# Patient Record
Sex: Female | Born: 1985 | Race: White | Hispanic: No | Marital: Married | Smoking: Never smoker
Health system: Southern US, Community
[De-identification: ages and names within clinical notes are randomized; demographics above are authoritative.]

## PROBLEM LIST (undated history)

## (undated) DIAGNOSIS — Z789 Other specified health status: Secondary | ICD-10-CM

## (undated) HISTORY — PX: WISDOM TOOTH EXTRACTION: SHX21

## (undated) HISTORY — PX: NO PAST SURGERIES: SHX2092

---

## 2016-12-09 NOTE — L&D Delivery Note (Signed)
Operative Delivery Note At 8:53 PM a viable female was delivered via Vaginal, Vacuum Investment banker, operational).  Presentation: vertex; Position: Occiput, anterior, Station: +2.  Patient pushed one hour with good effort and good descent. However, meternal and fetal tachycardia noted with temperature of 100.85F. Fetal heart rate then had decreased variability unresponsive to scalp stimulation and with recurrent variable decelerations. Decision made to proceed with VAVD.  Verbal consent: obtained from patient.  Risks and benefits discussed in detail.  Risks include, but are not limited to the risks of anesthesia, bleeding, infection, damage to maternal tissues, fetal cephalhematoma.  There is also the risk of inability to effect vaginal delivery of the head, or shoulder dystocia that cannot be resolved by established maneuvers, leading to the need for emergency cesarean section. Verbally consented and agreed. Bladder was emptied. EFW 7#. Anesthesia found to be adequate. Position confirmed to be OA. Pelvis felt to be adequate. Vacuum placed at flexion point. Vertex easily delivered over two contractions.    APGAR: 9, ; weight  .   Placenta status: Routine .   Cord:  WNL with the following complications: chorioamnionitis.  Cord pH: sent  Anesthesia:   Instruments: None Episiotomy: None Lacerations: 2nd degree;Vaginal Suture Repair: 2.0 3.0 vicryl rapide Est. Blood Loss (mL):  800cc, mostly s/s vaginal tissue being denuded, no bleeding after repair. No atony. Placenta intact. Rectal exam wnl - no evidence of occult third or fourth degree.   It's a girl "Sheryl Green"! Mom to postpartum.  Baby to Couplet care / Skin to Skin.  Ranae Pila 08/22/2017, 9:26 PM

## 2017-02-04 LAB — OB RESULTS CONSOLE HEPATITIS B SURFACE ANTIGEN
HEP B S AG: NEGATIVE
Hepatitis B Surface Ag: NEGATIVE

## 2017-02-04 LAB — OB RESULTS CONSOLE ABO/RH
RH TYPE: POSITIVE
RH Type: POSITIVE

## 2017-02-04 LAB — OB RESULTS CONSOLE GC/CHLAMYDIA
Chlamydia: NEGATIVE
Chlamydia: NEGATIVE
Gonorrhea: NEGATIVE
Gonorrhea: NEGATIVE

## 2017-02-04 LAB — OB RESULTS CONSOLE ANTIBODY SCREEN
ANTIBODY SCREEN: NEGATIVE
Antibody Screen: NEGATIVE

## 2017-02-04 LAB — OB RESULTS CONSOLE RUBELLA ANTIBODY, IGM
Rubella: IMMUNE
Rubella: IMMUNE

## 2017-02-04 LAB — OB RESULTS CONSOLE HIV ANTIBODY (ROUTINE TESTING)
HIV: NONREACTIVE
HIV: NONREACTIVE

## 2017-02-04 LAB — OB RESULTS CONSOLE RPR
RPR: NONREACTIVE
RPR: NONREACTIVE

## 2017-08-12 ENCOUNTER — Encounter (HOSPITAL_COMMUNITY): Payer: Self-pay

## 2017-08-12 LAB — OB RESULTS CONSOLE GBS: STREP GROUP B AG: POSITIVE

## 2017-08-16 NOTE — H&P (Signed)
Sheryl PolStephanie Green is a 31 y.o. female presenting for primary c/s for maternal request.  Patient has severe anxiety regarding pelvic exams and wishes primary c/s.  GBS -  Pregnancy otherwise uncomplicated. OB History    No data available     No past medical history on file. Past Surgical History:  Procedure Laterality Date  . WISDOM TOOTH EXTRACTION     Family History: family history includes Breast cancer in her maternal grandmother; Colon cancer in her maternal grandmother; Hypertension in her father. Social History:  has no tobacco, alcohol, and drug history on file.     Maternal Diabetes: No Genetic Screening: Normal Maternal Ultrasounds/Referrals: Normal Fetal Ultrasounds or other Referrals:  None Maternal Substance Abuse:  No Significant Maternal Medications:  None Significant Maternal Lab Results:  None Other Comments:  None  ROS History   There were no vitals taken for this visit. Exam Physical Exam  Prenatal labs: ABO, Rh: A/Positive/-- (02/27 0000) Antibody: Negative (02/27 0000) Rubella: Immune (02/27 0000) RPR: Nonreactive (02/27 0000)  HBsAg: Negative (02/27 0000)  HIV: Non-reactive (02/27 0000)  GBS:     Assessment/Plan: IUP at 39weeks C/S on maternal request Risks and benefits of C/S were discussed.  All questions were answered and informed consent was obtained.  Plan to proceed with low segment transverse Cesarean Section.   Sheryl Green C 08/16/2017, 11:39 AM

## 2017-08-18 ENCOUNTER — Telehealth (HOSPITAL_COMMUNITY): Payer: Self-pay | Admitting: *Deleted

## 2017-08-18 ENCOUNTER — Encounter (HOSPITAL_COMMUNITY): Payer: Self-pay

## 2017-08-18 NOTE — Telephone Encounter (Signed)
Preadmission screen  

## 2017-08-20 ENCOUNTER — Telehealth (HOSPITAL_COMMUNITY): Payer: Self-pay | Admitting: *Deleted

## 2017-08-20 NOTE — Telephone Encounter (Signed)
Preadmission screen  

## 2017-08-21 ENCOUNTER — Encounter (HOSPITAL_COMMUNITY): Payer: Self-pay

## 2017-08-22 ENCOUNTER — Encounter (HOSPITAL_COMMUNITY): Payer: Self-pay | Admitting: *Deleted

## 2017-08-22 ENCOUNTER — Inpatient Hospital Stay (HOSPITAL_COMMUNITY): Payer: BC Managed Care – PPO | Admitting: Anesthesiology

## 2017-08-22 ENCOUNTER — Inpatient Hospital Stay (HOSPITAL_COMMUNITY)
Admission: AD | Admit: 2017-08-22 | Discharge: 2017-08-24 | DRG: 775 | Disposition: A | Discharge status: Home / Self Care | Payer: BC Managed Care – PPO | Source: Ambulatory Visit | Attending: Obstetrics and Gynecology | Admitting: Obstetrics and Gynecology

## 2017-08-22 DIAGNOSIS — O411230 Chorioamnionitis, third trimester, not applicable or unspecified: Secondary | ICD-10-CM | POA: Diagnosis present

## 2017-08-22 DIAGNOSIS — O26893 Other specified pregnancy related conditions, third trimester: Secondary | ICD-10-CM | POA: Diagnosis present

## 2017-08-22 DIAGNOSIS — Z3A38 38 weeks gestation of pregnancy: Secondary | ICD-10-CM | POA: Diagnosis not present

## 2017-08-22 DIAGNOSIS — O99824 Streptococcus B carrier state complicating childbirth: Principal | ICD-10-CM | POA: Diagnosis present

## 2017-08-22 LAB — TYPE AND SCREEN
ABO/RH(D): A POS
ANTIBODY SCREEN: NEGATIVE

## 2017-08-22 LAB — CBC
HCT: 34 % — ABNORMAL LOW (ref 36.0–46.0)
HEMOGLOBIN: 11 g/dL — AB (ref 12.0–15.0)
MCH: 25.8 pg — ABNORMAL LOW (ref 26.0–34.0)
MCHC: 32.4 g/dL (ref 30.0–36.0)
MCV: 79.8 fL (ref 78.0–100.0)
PLATELETS: 189 10*3/uL (ref 150–400)
RBC: 4.26 MIL/uL (ref 3.87–5.11)
RDW: 14.7 % (ref 11.5–15.5)
WBC: 8.9 10*3/uL (ref 4.0–10.5)

## 2017-08-22 LAB — ABO/RH: ABO/RH(D): A POS

## 2017-08-22 LAB — RPR: RPR Ser Ql: NONREACTIVE

## 2017-08-22 MED ORDER — OXYCODONE HCL 5 MG PO TABS: 5.0000 mg | ORAL_TABLET | ORAL | Status: DC | PRN

## 2017-08-22 MED ORDER — ZOLPIDEM TARTRATE 5 MG PO TABS: 5.0000 mg | ORAL_TABLET | Freq: Every evening | ORAL | Status: DC | PRN

## 2017-08-22 MED ORDER — BENZOCAINE-MENTHOL 20-0.5 % EX AERO
1.0000 "application " | INHALATION_SPRAY | CUTANEOUS | Status: DC | PRN
  Administered 2017-08-24: 1 via TOPICAL
  Filled 2017-08-22 (×2): qty 56

## 2017-08-22 MED ORDER — DEXTROSE 5 % IV SOLN
1.5000 mg/kg | Freq: Three times a day (TID) | INTRAVENOUS | Status: DC
  Filled 2017-08-22: qty 3

## 2017-08-22 MED ORDER — FENTANYL 2.5 MCG/ML BUPIVACAINE 1/10 % EPIDURAL INFUSION (WH - ANES)
14.0000 mL/h | INTRAMUSCULAR | Status: DC | PRN
  Administered 2017-08-22 (×2): 14 mL/h via EPIDURAL
  Filled 2017-08-22 (×2): qty 100

## 2017-08-22 MED ORDER — IBUPROFEN 600 MG PO TABS
600.0000 mg | ORAL_TABLET | Freq: Four times a day (QID) | ORAL | Status: DC
  Administered 2017-08-23 – 2017-08-24 (×6): 600 mg via ORAL
  Filled 2017-08-22 (×6): qty 1

## 2017-08-22 MED ORDER — TETANUS-DIPHTH-ACELL PERTUSSIS 5-2.5-18.5 LF-MCG/0.5 IM SUSP: 0.5000 mL | Freq: Once | INTRAMUSCULAR | Status: DC

## 2017-08-22 MED ORDER — ONDANSETRON HCL 4 MG/2ML IJ SOLN
4.0000 mg | INTRAMUSCULAR | Status: DC | PRN
Start: 2017-08-22 — End: 2017-08-24

## 2017-08-22 MED ORDER — OXYTOCIN BOLUS FROM INFUSION
500.0000 mL | Freq: Once | INTRAVENOUS | Status: AC
  Administered 2017-08-22: 500 mL via INTRAVENOUS

## 2017-08-22 MED ORDER — PHENYLEPHRINE 40 MCG/ML (10ML) SYRINGE FOR IV PUSH (FOR BLOOD PRESSURE SUPPORT)
80.0000 ug | PREFILLED_SYRINGE | INTRAVENOUS | Status: DC | PRN
  Filled 2017-08-22: qty 5
  Filled 2017-08-22: qty 10

## 2017-08-22 MED ORDER — ONDANSETRON HCL 4 MG/2ML IJ SOLN: 4.0000 mg | Freq: Four times a day (QID) | INTRAMUSCULAR | Status: DC | PRN

## 2017-08-22 MED ORDER — ACETAMINOPHEN 325 MG PO TABS: 650.0000 mg | ORAL_TABLET | Freq: Four times a day (QID) | ORAL | Status: DC | PRN

## 2017-08-22 MED ORDER — ONDANSETRON HCL 4 MG PO TABS: 4.0000 mg | ORAL_TABLET | ORAL | Status: DC | PRN

## 2017-08-22 MED ORDER — MISOPROSTOL 200 MCG PO TABS
ORAL_TABLET | ORAL | Status: AC
  Filled 2017-08-22: qty 5

## 2017-08-22 MED ORDER — OXYTOCIN 40 UNITS IN LACTATED RINGERS INFUSION - SIMPLE MED: 2.5000 [IU]/h | INTRAVENOUS | Status: DC

## 2017-08-22 MED ORDER — ACETAMINOPHEN 325 MG PO TABS
650.0000 mg | ORAL_TABLET | ORAL | Status: DC | PRN
  Administered 2017-08-23: 650 mg via ORAL
  Filled 2017-08-22: qty 2

## 2017-08-22 MED ORDER — OXYCODONE-ACETAMINOPHEN 5-325 MG PO TABS: 1.0000 | ORAL_TABLET | ORAL | Status: DC | PRN

## 2017-08-22 MED ORDER — DIPHENHYDRAMINE HCL 25 MG PO CAPS: 25.0000 mg | ORAL_CAPSULE | Freq: Four times a day (QID) | ORAL | Status: DC | PRN

## 2017-08-22 MED ORDER — ACETAMINOPHEN 325 MG PO TABS
650.0000 mg | ORAL_TABLET | ORAL | Status: DC | PRN
  Administered 2017-08-22: 650 mg via ORAL
  Filled 2017-08-22: qty 2

## 2017-08-22 MED ORDER — LACTATED RINGERS IV SOLN
500.0000 mL | INTRAVENOUS | Status: DC | PRN
  Administered 2017-08-22: 500 mL via INTRAVENOUS

## 2017-08-22 MED ORDER — EPHEDRINE 5 MG/ML INJ
10.0000 mg | INTRAVENOUS | Status: DC | PRN
  Filled 2017-08-22: qty 2

## 2017-08-22 MED ORDER — LIDOCAINE HCL (PF) 1 % IJ SOLN
30.0000 mL | INTRAMUSCULAR | Status: DC | PRN
  Filled 2017-08-22: qty 30

## 2017-08-22 MED ORDER — OXYCODONE-ACETAMINOPHEN 5-325 MG PO TABS: 2.0000 | ORAL_TABLET | ORAL | Status: DC | PRN

## 2017-08-22 MED ORDER — WITCH HAZEL-GLYCERIN EX PADS: 1.0000 "application " | MEDICATED_PAD | CUTANEOUS | Status: DC | PRN

## 2017-08-22 MED ORDER — DIPHENHYDRAMINE HCL 50 MG/ML IJ SOLN: 12.5000 mg | INTRAMUSCULAR | Status: DC | PRN

## 2017-08-22 MED ORDER — PENICILLIN G POT IN DEXTROSE 60000 UNIT/ML IV SOLN
3.0000 10*6.[IU] | INTRAVENOUS | Status: DC
  Administered 2017-08-22 (×3): 3 10*6.[IU] via INTRAVENOUS
  Filled 2017-08-22 (×4): qty 50

## 2017-08-22 MED ORDER — SODIUM CHLORIDE 0.9 % IV SOLN
2.0000 g | Freq: Four times a day (QID) | INTRAVENOUS | Status: DC
  Filled 2017-08-22: qty 2000

## 2017-08-22 MED ORDER — LACTATED RINGERS IV SOLN
500.0000 mL | Freq: Once | INTRAVENOUS | Status: AC
  Administered 2017-08-22: 500 mL via INTRAVENOUS

## 2017-08-22 MED ORDER — OXYCODONE HCL 5 MG PO TABS
10.0000 mg | ORAL_TABLET | ORAL | Status: DC | PRN
Start: 2017-08-22 — End: 2017-08-24

## 2017-08-22 MED ORDER — SOD CITRATE-CITRIC ACID 500-334 MG/5ML PO SOLN
30.0000 mL | ORAL | Status: DC | PRN
  Filled 2017-08-22: qty 15

## 2017-08-22 MED ORDER — LIDOCAINE HCL (PF) 1 % IJ SOLN
INTRAMUSCULAR | Status: DC | PRN
  Administered 2017-08-22: 6 mL via EPIDURAL
  Administered 2017-08-22: 7 mL via EPIDURAL

## 2017-08-22 MED ORDER — PRENATAL MULTIVITAMIN CH
1.0000 | ORAL_TABLET | Freq: Every day | ORAL | Status: DC
  Administered 2017-08-23 – 2017-08-24 (×2): 1 via ORAL
  Filled 2017-08-22 (×2): qty 1

## 2017-08-22 MED ORDER — FLEET ENEMA 7-19 GM/118ML RE ENEM: 1.0000 | ENEMA | RECTAL | Status: DC | PRN

## 2017-08-22 MED ORDER — DIBUCAINE 1 % RE OINT: 1.0000 "application " | TOPICAL_OINTMENT | RECTAL | Status: DC | PRN

## 2017-08-22 MED ORDER — PHENYLEPHRINE 40 MCG/ML (10ML) SYRINGE FOR IV PUSH (FOR BLOOD PRESSURE SUPPORT)
80.0000 ug | PREFILLED_SYRINGE | INTRAVENOUS | Status: DC | PRN
  Administered 2017-08-22: 80 ug via INTRAVENOUS
  Filled 2017-08-22: qty 5

## 2017-08-22 MED ORDER — SIMETHICONE 80 MG PO CHEW: 80.0000 mg | CHEWABLE_TABLET | ORAL | Status: DC | PRN

## 2017-08-22 MED ORDER — TERBUTALINE SULFATE 1 MG/ML IJ SOLN
0.2500 mg | Freq: Once | INTRAMUSCULAR | Status: DC | PRN
  Filled 2017-08-22: qty 1

## 2017-08-22 MED ORDER — PENICILLIN G POTASSIUM 5000000 UNITS IJ SOLR
5.0000 10*6.[IU] | Freq: Once | INTRAVENOUS | Status: AC
  Administered 2017-08-22: 5 10*6.[IU] via INTRAVENOUS
  Filled 2017-08-22: qty 5

## 2017-08-22 MED ORDER — OXYTOCIN 40 UNITS IN LACTATED RINGERS INFUSION - SIMPLE MED
1.0000 m[IU]/min | INTRAVENOUS | Status: DC
  Administered 2017-08-22: 2 m[IU]/min via INTRAVENOUS
  Filled 2017-08-22: qty 1000

## 2017-08-22 MED ORDER — LACTATED RINGERS IV SOLN
INTRAVENOUS | Status: DC
  Administered 2017-08-22 (×2): via INTRAVENOUS

## 2017-08-22 MED ORDER — COCONUT OIL OIL
1.0000 "application " | TOPICAL_OIL | Status: DC | PRN
  Administered 2017-08-23: 1 via TOPICAL
  Filled 2017-08-22: qty 120

## 2017-08-22 MED ORDER — SENNOSIDES-DOCUSATE SODIUM 8.6-50 MG PO TABS
2.0000 | ORAL_TABLET | ORAL | Status: DC
  Administered 2017-08-23: 2 via ORAL
  Filled 2017-08-22: qty 2

## 2017-08-22 NOTE — Progress Notes (Signed)
C/c/+2, will begin second stage.    Rosie Fate MD

## 2017-08-22 NOTE — MAU Note (Addendum)
PT  SAYS SROM AT 0430-  CLEAR  FLUID.   SAYS HAD DESIRED  A  C/S  BUT NOW  WANTS VAG DEL WITH AN EPIDURAL    DENIES HSV AND MRSA.      GBS- POSITIVE.

## 2017-08-22 NOTE — Anesthesia Pain Management Evaluation Note (Signed)
  CRNA Pain Management Visit Note  Patient: Sheryl Green, 31 y.o., female  "Hello I am a member of the anesthesia team at [facility]. We have an anesthesia team available at all times to provide care throughout the hospital, including epidural management and anesthesia for C-section. I don't know your plan for the delivery whether it a natural birth, water birth, IV sedation, nitrous supplementation, doula or epidural, but we want to meet your pain goals."   1.Was your pain managed to your expectations on prior hospitalizations?   No prior hospitalizations  2.What is your expectation for pain management during this hospitalization?     Epidural  3.How can we help you reach that goal? unsure  Record the patient's initial score and the patient's pain goal.   Pain: 3  Pain Goal: 4 [facility] wants you to be able to say your pain was always managed very well.  Cephus Shelling 08/22/2017

## 2017-08-22 NOTE — Progress Notes (Signed)
4/100/-2 Continue pitocin titration.    Rosie Fate MD

## 2017-08-22 NOTE — Anesthesia Procedure Notes (Signed)
Epidural Patient location during procedure: OB Start time: 08/22/2017 8:12 AM End time: 08/22/2017 8:15 AM  Staffing Anesthesiologist: Leilani Able Performed: anesthesiologist   Preanesthetic Checklist Completed: patient identified, surgical consent, pre-op evaluation, timeout performed, IV checked, risks and benefits discussed and monitors and equipment checked  Epidural Patient position: sitting Prep: site prepped and draped and DuraPrep Patient monitoring: continuous pulse ox and blood pressure Approach: midline Location: L3-L4 Injection technique: LOR air  Needle:  Needle type: Tuohy  Needle gauge: 17 G Needle length: 9 cm and 9 Needle insertion depth: 6 cm Catheter type: closed end flexible Catheter size: 19 Gauge Catheter at skin depth: 11 cm Test dose: negative and Other  Assessment Sensory level: T9 Events: blood not aspirated, injection not painful, no injection resistance, negative IV test and no paresthesia  Additional Notes Reason for block:procedure for pain

## 2017-08-22 NOTE — Anesthesia Preprocedure Evaluation (Addendum)
Anesthesia Evaluation  Patient identified by MRN, date of birth, ID band Patient awake    Reviewed: Allergy & Precautions, H&P , NPO status , Patient's Chart, lab work & pertinent test results  Airway Mallampati: I  TM Distance: >3 FB Neck ROM: full    Dental no notable dental hx. (+) Teeth Intact   Pulmonary neg pulmonary ROS,    Pulmonary exam normal breath sounds clear to auscultation       Cardiovascular negative cardio ROS Normal cardiovascular exam Rhythm:regular Rate:Normal     Neuro/Psych negative neurological ROS  negative psych ROS   GI/Hepatic negative GI ROS, Neg liver ROS,   Endo/Other  negative endocrine ROS  Renal/GU negative Renal ROS     Musculoskeletal   Abdominal Normal abdominal exam  (+)   Peds  Hematology negative hematology ROS (+)   Anesthesia Other Findings   Reproductive/Obstetrics (+) Pregnancy                            Anesthesia Physical Anesthesia Plan  ASA: II  Anesthesia Plan: Epidural   Post-op Pain Management:    Induction:   PONV Risk Score and Plan:   Airway Management Planned:   Additional Equipment:   Intra-op Plan:   Post-operative Plan:   Informed Consent: I have reviewed the patients History and Physical, chart, labs and discussed the procedure including the risks, benefits and alternatives for the proposed anesthesia with the patient or authorized representative who has indicated his/her understanding and acceptance.     Plan Discussed with:   Anesthesia Plan Comments:         Anesthesia Quick Evaluation  

## 2017-08-22 NOTE — Progress Notes (Addendum)
Please see H&P by Dr. Rana Snare. The only thing changed is patient requested to try vaginally.   S: no complaints. Comf w/cle.   O: AFVSS SVE 3/100/-2, vertex, clear fluid leaking  A/P: 31 yo G1P0 @ 38.4 wga presenting grossly ruptured. Now comf w/cle. Pitocin initiated. GBS pos - PCN per protocol.   Rosie Fate MD

## 2017-08-23 LAB — CBC
HCT: 29 % — ABNORMAL LOW (ref 36.0–46.0)
HEMOGLOBIN: 9.5 g/dL — AB (ref 12.0–15.0)
MCH: 26.2 pg (ref 26.0–34.0)
MCHC: 32.8 g/dL (ref 30.0–36.0)
MCV: 80.1 fL (ref 78.0–100.0)
PLATELETS: 182 10*3/uL (ref 150–400)
RBC: 3.62 MIL/uL — AB (ref 3.87–5.11)
RDW: 14.8 % (ref 11.5–15.5)
WBC: 14.3 10*3/uL — ABNORMAL HIGH (ref 4.0–10.5)

## 2017-08-23 MED ORDER — LIDOCAINE HCL (PF) 1 % IJ SOLN
INTRAMUSCULAR | Status: DC | PRN
  Administered 2017-08-22 (×2): 7 mL via EPIDURAL

## 2017-08-23 MED ORDER — FENTANYL 2.5 MCG/ML W/ROPIVACAINE 0.15% IN NS 100 ML EPIDURAL (ARMC)
EPIDURAL | Status: DC | PRN
  Administered 2017-08-22: 14 mL/h via EPIDURAL

## 2017-08-23 NOTE — Progress Notes (Signed)
MOB was referred for history of depression/anxiety.  Referral is screened out by Clinical Social Worker because none of the following criteria appear to apply and there are no reports impacting the pregnancy or her transition to the postpartum period. CSW does not deem it clinically necessary to further investigate at this time.  -History of anxiety/depression during this pregnancy, or of post-partum depression. - Diagnosis of anxiety and/or depression within last 3 years.-  - History of depression due to pregnancy loss/loss of child or -MOB's symptoms are currently being treated with medication and/or therapy.  CSW reviewed PNC records and saw noted that patient experienced anxious feelings only with pelvic exam which is normal and not unusual. Please contact the Clinical Social Worker if needs arise or upon MOB request.   Ruthie Berch, MSW, LCSW-A Clinical Social Worker  [facility]  Office: [phone]  

## 2017-08-23 NOTE — Anesthesia Procedure Notes (Addendum)
Epidural Patient location during procedure: OB Start time: 08/22/2017 8:13 AM End time: 08/22/2017 8:17 AM  Staffing Anesthesiologist: Leilani Able Performed: anesthesiologist   Preanesthetic Checklist Completed: patient identified, surgical consent, pre-op evaluation, timeout performed, IV checked, risks and benefits discussed and monitors and equipment checked  Epidural Patient position: sitting Prep: site prepped and draped and DuraPrep Patient monitoring: continuous pulse ox and blood pressure Approach: midline Location: L3-L4 Injection technique: LOR air  Needle:  Needle type: Tuohy  Needle gauge: 17 G Needle length: 9 cm and 9 Needle insertion depth: 5 cm cm Catheter type: closed end flexible Catheter size: 19 Gauge Catheter at skin depth: 10 cm Test dose: negative and Other  Assessment Sensory level: T9 Events: blood not aspirated, injection not painful, no injection resistance, negative IV test and no paresthesia  Additional Notes Reason for block:procedure for pain

## 2017-08-23 NOTE — Anesthesia Rounding Note (Signed)
  CRNA Epidural Rounding Note  Patient: Sheryl Green, 31 y.o., female  Patient's current pain level: Pain Score: 0-No pain (08/22/17 2340)  Agreed upon pain management level: post epidural note   Epidural intervention:post epidural note Patient walking without problems. No anesthesia complications.  Comments: Patient comfortable moving legs without problems. VSS.   Rica Records 08/23/2017

## 2017-08-23 NOTE — Progress Notes (Signed)
Post Partum Day 1 Subjective: no complaints, up ad lib, voiding, tolerating PO and Was dizzy and with mild SOB overnight - both have not completely resolved after ate. Ambulate to ambulate to bathroom without issues. Lochia appropriate.   Objective: Blood pressure 116/67, pulse 72, temperature 98.7 F (37.1 C), temperature source Oral, resp. rate 16, height  (1.676 m), weight 79.4 kg (175 lb), last menstrual period 11/25/2016, SpO2 97 %, unknown if currently breastfeeding.  Physical Exam:  General: alert, cooperative and appears stated age Lochia: appropriate Uterine Fundus: firm Incision: healing well, no significant drainage, no dehiscence, no significant erythema DVT Evaluation: No evidence of DVT seen on physical exam. Negative Homan's sign. No cords or calf tenderness. No significant calf/ankle edema.   Recent Labs  08/22/17 0635 08/23/17 0255  HGB 11.0* 9.5*  HCT 34.0* 29.0*    Assessment/Plan: Plan for discharge tomorrow, Breastfeeding and Lactation consult Dizziness/SOB resolved after pt ate. Now ambulating,e tc without issues.   LOS: 1 day   Ranae Pila 08/23/2017, 8:23 AM

## 2017-08-23 NOTE — Anesthesia Postprocedure Evaluation (Signed)
Anesthesia Post Note  Patient: Sheryl Green  Procedure(s) Performed: * No procedures listed *     Patient location during evaluation: Mother Baby Anesthesia Type: Epidural Level of consciousness: awake Pain management: pain level controlled Vital Signs Assessment: post-procedure vital signs reviewed and stable Respiratory status: spontaneous breathing Cardiovascular status: stable Postop Assessment: no headache, no backache, epidural receding, patient able to bend at knees and no apparent nausea or vomiting Anesthetic complications: no    Last Vitals:  Vitals:   08/23/17 1400 08/23/17 1756  BP: 114/64 122/61  Pulse: 82 83  Resp: 18 18  Temp: 36.9 C 36.9 C  SpO2:      Last Pain:  Vitals:   08/23/17 1923  TempSrc:   PainSc: 2    Pain Goal:                 Eryn Marandola JR,JOHN Cayci Mcnabb

## 2017-08-23 NOTE — Lactation Note (Signed)
This note was copied from a baby's chart. Lactation Consultation Note New mom pale, dizzy upon standing. Having stat labs drawn. Mom stated she lost a liter of blood.  Mom laying back in bed. Has small round breast w/inverted nipples. Has good amount of breast tissue. Hand expressed 2 ml thick yellow colostrum w/mom laying semi flat.  W/finger stimulation, rolled out nipples for latching. Has short shaft, unable to latch. Fitted mom #20 NS to Lt. Nipple. Assisted in football position. Slowly raised mom HOB up. Mom was fine to sit up slowly. Mom feels weak prior to sitting upright. Mom stated nipples a little sore from trying to latch baby to breast. Mom c/o some pinching. Adjusted flange and chin tug. Mom stated better but still some pinching. Adjusted again. Baby fed well. Taught mom to release latch. Noted a drop of colostrum in NS. Fitted #24 NS. Fills baby's mouth d/t width. Baby didn't gag. Adjusted flange wider then mom stated better than #20. Baby BF well. Only colostrum noted on nipple after feeding w/#24 NS, none in shield.  W/curve tip syring inserted in NS 2 ml colostrum.   Taught mom  "C" hold and NS application. Shells given. DEBP set up to pump later, mom exhausted. Mom shown how to use DEBP & how to disassemble, clean, & reassemble parts. Reviewed w/FOB as well d/t mom tired.  Mom encouraged to feed baby 8-12 times/24 hours and with feeding cues. If hasn't cued to BF in 3 hours stimulate to feed.  [facility]/LC brochure given w/resources, support groups and LC services.  Patient Name: Sheryl Green Today's Date: 08/23/2017 Reason for consult: Initial assessment   Maternal Data Has patient been taught Hand Expression?: Yes Does the patient have breastfeeding experience prior to this delivery?: No  Feeding Feeding Type: Breast Milk Length of feed: 35 min  LATCH Score Latch: Repeated attempts needed to sustain latch, nipple held in mouth throughout feeding, stimulation needed to  elicit sucking reflex.  Audible Swallowing: None  Type of Nipple: Inverted  Comfort (Breast/Nipple): Filling, red/small blisters or bruises, mild/mod discomfort  Hold (Positioning): Full assist, staff holds infant at breast  LATCH Score: 2  Interventions Interventions: Breast feeding basics reviewed;Support pillows;Assisted with latch;Position options;Skin to skin;Expressed milk;Breast massage;Hand express;Shells;DEBP;Breast compression;Adjust position  Lactation Tools Discussed/Used Tools: Shells;Nipple Shields Nipple shield size: 20;24 Shell Type: Inverted Breast pump type: Double-Electric Breast Pump Pump Review: Setup, frequency, and cleaning;Milk Storage Initiated by:: Peri Jefferson RN IBCLC Date initiated:: 08/23/17   Consult Status Consult Status: Follow-up Date: 08/23/17 Follow-up type: In-patient    Jacobo Moncrief, Sheryl Green 08/23/2017, 4:05 AM

## 2017-08-23 NOTE — Anesthesia Preprocedure Evaluation (Signed)
Anesthesia Evaluation  Patient identified by MRN, date of birth, ID band Patient awake    Reviewed: Allergy & Precautions, H&P , NPO status , Patient's Chart, lab work & pertinent test results  Airway Mallampati: I  TM Distance: >3 FB Neck ROM: full    Dental no notable dental hx. (+) Teeth Intact   Pulmonary neg pulmonary ROS,    Pulmonary exam normal breath sounds clear to auscultation       Cardiovascular negative cardio ROS Normal cardiovascular exam Rhythm:regular Rate:Normal     Neuro/Psych negative neurological ROS  negative psych ROS   GI/Hepatic negative GI ROS, Neg liver ROS,   Endo/Other  negative endocrine ROS  Renal/GU negative Renal ROS     Musculoskeletal   Abdominal Normal abdominal exam  (+)   Peds  Hematology negative hematology ROS (+)   Anesthesia Other Findings   Reproductive/Obstetrics (+) Pregnancy                            Anesthesia Physical Anesthesia Plan  ASA: II  Anesthesia Plan: Epidural   Post-op Pain Management:    Induction:   PONV Risk Score and Plan:   Airway Management Planned:   Additional Equipment:   Intra-op Plan:   Post-operative Plan:   Informed Consent: I have reviewed the patients History and Physical, chart, labs and discussed the procedure including the risks, benefits and alternatives for the proposed anesthesia with the patient or authorized representative who has indicated his/her understanding and acceptance.     Plan Discussed with:   Anesthesia Plan Comments:         Anesthesia Quick Evaluation  

## 2017-08-23 NOTE — Progress Notes (Signed)
Patient showing signs of dizziness and shortness of breath vital signs WNL.  Contacted Belva Agee, md. Verbal order for stat CBC given, while continuing to monitor.

## 2017-08-23 NOTE — Plan of Care (Signed)
Problem: Nutritional: Goal: Mothers verbalization of comfort with breastfeeding process will improve Outcome: Progressing Making progress with breastfeeding using a nipple shield with latching and pumping to stimulate and supplement her newborn. Lactation Consultant and Nursing staff have assisted mother and infant with feeding.

## 2017-08-24 MED ORDER — FERROUS SULFATE 325 (65 FE) MG PO TBEC: 325.0000 mg | DELAYED_RELEASE_TABLET | Freq: Two times a day (BID) | ORAL | 2 refills | Status: AC

## 2017-08-24 MED ORDER — DOCUSATE SODIUM 100 MG PO CAPS: 100.0000 mg | ORAL_CAPSULE | Freq: Two times a day (BID) | ORAL | 2 refills | Status: AC

## 2017-08-24 NOTE — Discharge Summary (Signed)
Obstetric Discharge Summary Reason for Admission: rupture of membranes Prenatal Procedures: none Intrapartum Procedures: vacuum Postpartum Procedures: none Complications-Operative and Postpartum: second degree perineal laceration Hemoglobin  Date Value Ref Range Status  08/23/2017 9.5 (L) 12.0 - 15.0 g/dL Final   HCT  Date Value Ref Range Status  08/23/2017 29.0 (L) 36.0 - 46.0 % Final    Physical Exam:  General: alert, cooperative and appears stated age 31: appropriate Uterine Fundus: firm Incision: healing well, no significant drainage, no dehiscence, no significant erythema DVT Evaluation: No evidence of DVT seen on physical exam. Negative Homan's sign. No cords or calf tenderness. No significant calf/ankle edema.  Discharge Diagnoses: Term Pregnancy-delivered  Discharge Information: Date: 08/24/2017 Activity: pelvic rest Diet: routine Medications: Colace and Iron Condition: stable Instructions: refer to practice specific booklet Discharge to: home   Newborn Data: Live born female  Birth Weight: 8 lb 3.4 oz (3725 g) APGAR: 9,   Home with mother.  Sheryl Green 08/24/2017, 9:30 AM

## 2017-08-24 NOTE — Lactation Note (Signed)
This note was copied from a baby's chart. Lactation Consultation Note:  Mother has bilateral cracks in the center of her nipples that are scabbed over. Mother reports pain scale of #7. Mother requesting latch assistance. Mother has been latching infant with and without a nipple shield but very painful.  Infant has been getting formula after breastfeeding attempts.  Mother has supplemental guidelines. Mother assist with sitting up in chair at the bedside. Infant positioned in football hold and attempt to latch on the Rt breast. Infant opened wide and latched on for a few good strong sucks. Infant had wide open gape. Mother described pain severe pain. Infant was unlatched quickly. Mother fit with a #24 nipple shield. Shield was prefilled with formula. Infant latched for a few sucks and mother unable to tolerate latch. Latch very painful. Assessed infants oral cavity and found that infant has a high palate and a short , thick, posterior frenula. I feel that this is contributing to mothers painful cracked nipples and poor latch.   Mother was given a plan of care. Advised to get [facility] Rx from OB. Encouraged mother to take a break from breastfeeding until nipples heal at least 24 hours of using APNO.  Mother to pump with Spectra pump every 2-3 hours for 15-20 mins. Mother to continue to supplement with formula and EBM until milk comes to volume. Advised to use a wide base bottle nipple and taught to pace bottle feed infant.  Mother to attempt to breastfeed again with or without the nipple shield after her nipples heal. Advised to get follow up for evaluation from Dr Lexine Baton. . Mother advised to pump on low setting and informed that pumping should not be painful.  Mother is aware of available LC services, BFSG'S and Outpatient services. Mother has phone number to call with any feeding concerns and for OP visit if needed.   Patient Name: Sheryl Green [id number] Date: 08/24/2017 Reason for consult: Follow-up  assessment   Maternal Data    Feeding Feeding Type: Formula Length of feed:  (only a few sucks, severe pain)  LATCH Score Latch: Grasps breast easily, tongue down, lips flanged, rhythmical sucking.  Audible Swallowing: A few with stimulation  Type of Nipple: Everted at rest and after stimulation  Comfort (Breast/Nipple): Engorged, cracked, bleeding, large blisters, severe discomfort  Hold (Positioning): Assistance needed to correctly position infant at breast and maintain latch.  LATCH Score: 6  Interventions    Lactation Tools Discussed/Used Tools: Nipple Shields Nipple shield size: 24 Shell Type: Sore Breast pump type: Double-Electric Breast Pump;Manual   Consult Status Consult Status: Complete    Michel Bickers 08/24/2017, 2:03 PM

## 2017-08-26 ENCOUNTER — Encounter (HOSPITAL_COMMUNITY): Payer: Self-pay | Admitting: *Deleted

## 2017-08-26 ENCOUNTER — Encounter (HOSPITAL_COMMUNITY)
Admission: RE | Admit: 2017-08-26 | Discharge: 2017-08-26 | Disposition: A | Discharge status: Home / Self Care | Payer: Self-pay | Source: Ambulatory Visit

## 2017-08-26 HISTORY — DX: Other specified health status: Z78.9

## 2017-08-26 NOTE — H&P (Signed)
Sheryl Green is a 31 y.o. female presenting for SROM at term. OB History as of 08/24/17    Gravida Para Term Preterm AB Living   SAB TAB Ectopic Multiple Live Births         0 1     Past Medical History:  Diagnosis Date  . Medical history non-contributory    Past Surgical History:  Procedure Laterality Date  . WISDOM TOOTH EXTRACTION     Family History: family history includes Breast cancer in her maternal grandmother; Colon cancer in her maternal grandmother; Hypertension in her father. Social History:  reports that she has never smoked. She has never used smokeless tobacco. She reports that she does not drink alcohol or use drugs.     Maternal Diabetes: No Genetic Screening: Normal Maternal Ultrasounds/Referrals: Normal Fetal Ultrasounds or other Referrals:  None Maternal Substance Abuse:  No Significant Maternal Medications:  None Significant Maternal Lab Results:  None Other Comments:  None  Review of Systems  All other systems reviewed and are negative.  Maternal Medical History:  Reason for admission: Rupture of membranes and contractions.     Dilation: 10 Effacement (%): 100 Station: 0 Exam by:: Lujean Rave rN Blood pressure (!) 100/59, pulse 82, temperature 98.3 F (36.8 C), temperature source Oral, resp. rate 18, height  (1.676 m), weight 79.4 kg (175 lb), last menstrual period 11/25/2016, SpO2 97 %, unknown if currently breastfeeding. Maternal Exam:  Abdomen: Fetal presentation: vertex     Fetal Exam Fetal State Assessment: Category I - tracings are normal.     Physical Exam  Nursing note and vitals reviewed. Constitutional: She appears well-developed and well-nourished.  HENT:  Head: Normocephalic.  Eyes: Pupils are equal, round, and reactive to light.  Neck: Normal range of motion.  Cardiovascular: Normal rate and regular rhythm.   Respiratory: Effort normal.  GI: Soft.    Prenatal labs: ABO, Rh: --/--/A POS, A  POS (09/14 1191) Antibody: NEG (09/14 0635) Rubella: Immune, Immune (02/27 0000) RPR: Non Reactive (09/14 0635)  HBsAg: Negative, Negative (02/27 0000)  HIV: Non-reactive, Non-reactive (02/27 0000)  GBS: Positive (09/04 0000)   Assessment/Plan: IUP at term SROM Admit epidural   Syrah Daughtrey L 08/26/2017, 9:24 AM

## 2017-08-27 ENCOUNTER — Inpatient Hospital Stay (HOSPITAL_COMMUNITY)
Admission: RE | Admit: 2017-08-27 | Payer: BC Managed Care – PPO | Source: Ambulatory Visit | Admitting: Obstetrics and Gynecology

## 2017-08-27 SURGERY — Surgical Case
Anesthesia: Regional

## 2017-09-09 NOTE — Anesthesia Postprocedure Evaluation (Signed)
Anesthesia Post Note  Patient: Sheryl Green  Procedure(s) Performed: AN AD HOC LABOR EPIDURAL     Patient location during evaluation: Mother Baby Anesthesia Type: Epidural Level of consciousness: awake Pain management: pain level controlled Vital Signs Assessment: post-procedure vital signs reviewed and stable Respiratory status: spontaneous breathing Cardiovascular status: stable Postop Assessment: no headache, no backache, epidural receding, patient able to bend at knees and no apparent nausea or vomiting Anesthetic complications: no    Last Vitals: There were no vitals filed for this visit.  Last Pain: There were no vitals filed for this visit. Pain Goal:                 Ronte Parker JR,JOHN Garlin Batdorf

## 2019-10-25 ENCOUNTER — Other Ambulatory Visit: Payer: Self-pay | Admitting: Radiology

## 2019-10-25 DIAGNOSIS — N6310 Unspecified lump in the right breast, unspecified quadrant: Secondary | ICD-10-CM

## 2019-11-10 ENCOUNTER — Ambulatory Visit
Admission: RE | Admit: 2019-11-10 | Discharge: 2019-11-10 | Disposition: A | Discharge status: Home / Self Care | Payer: BC Managed Care – PPO | Source: Ambulatory Visit | Attending: Radiology | Admitting: Radiology

## 2019-11-10 ENCOUNTER — Other Ambulatory Visit: Payer: Self-pay | Admitting: Radiology

## 2019-11-10 ENCOUNTER — Ambulatory Visit
Admission: RE | Admit: 2019-11-10 | Discharge: 2019-11-10 | Disposition: A | Discharge status: Home / Self Care | Payer: BLUE CROSS/BLUE SHIELD | Source: Ambulatory Visit | Attending: Radiology | Admitting: Radiology

## 2019-11-10 ENCOUNTER — Other Ambulatory Visit: Payer: Self-pay

## 2019-11-10 DIAGNOSIS — N6320 Unspecified lump in the left breast, unspecified quadrant: Secondary | ICD-10-CM

## 2019-11-10 DIAGNOSIS — N6310 Unspecified lump in the right breast, unspecified quadrant: Secondary | ICD-10-CM

## 2019-11-15 ENCOUNTER — Other Ambulatory Visit: Payer: BC Managed Care – PPO

## 2019-12-10 NOTE — L&D Delivery Note (Signed)
Delivery Note At 4:34 PM a viable female was delivered via Vaginal, Spontaneous (Presentation: Right Occiput Anterior).  APGAR: 8, 9; weight pending  .   Placenta status: Spontaneous;Expressed, Intact.  Cord: 3 vessels with the following complications: None.  Cord pH: not obtained  Anesthesia: Epidural Episiotomy: None Lacerations: 2nd degree Suture Repair: 3.0 chromic Est. Blood Loss (mL):  300  Mom to postpartum.  Baby to Couplet care / Skin to Skin.  Jeani Hawking 10/16/2020, 4:49 PM

## 2020-03-21 LAB — OB RESULTS CONSOLE RUBELLA ANTIBODY, IGM: Rubella: IMMUNE

## 2020-03-21 LAB — OB RESULTS CONSOLE GC/CHLAMYDIA
Chlamydia: NEGATIVE
Gonorrhea: NEGATIVE

## 2020-03-21 LAB — OB RESULTS CONSOLE GBS: GBS: POSITIVE

## 2020-03-21 LAB — OB RESULTS CONSOLE HIV ANTIBODY (ROUTINE TESTING): HIV: NONREACTIVE

## 2020-03-21 LAB — OB RESULTS CONSOLE HEPATITIS B SURFACE ANTIGEN: Hepatitis B Surface Ag: NEGATIVE

## 2020-03-21 LAB — OB RESULTS CONSOLE ABO/RH: RH Type: POSITIVE

## 2020-03-21 LAB — OB RESULTS CONSOLE ANTIBODY SCREEN: Antibody Screen: NEGATIVE

## 2020-03-21 LAB — OB RESULTS CONSOLE RPR: RPR: NONREACTIVE

## 2020-05-15 ENCOUNTER — Other Ambulatory Visit: Payer: Self-pay | Admitting: Radiology

## 2020-05-15 ENCOUNTER — Other Ambulatory Visit: Payer: Self-pay

## 2020-05-15 ENCOUNTER — Ambulatory Visit
Admission: RE | Admit: 2020-05-15 | Discharge: 2020-05-15 | Disposition: A | Discharge status: Home / Self Care | Payer: BLUE CROSS/BLUE SHIELD | Source: Ambulatory Visit | Attending: Radiology | Admitting: Radiology

## 2020-05-15 DIAGNOSIS — N6320 Unspecified lump in the left breast, unspecified quadrant: Secondary | ICD-10-CM

## 2020-07-28 ENCOUNTER — Other Ambulatory Visit: Payer: Self-pay | Admitting: Obstetrics and Gynecology

## 2020-07-28 DIAGNOSIS — N63 Unspecified lump in unspecified breast: Secondary | ICD-10-CM

## 2020-08-10 ENCOUNTER — Ambulatory Visit
Admission: RE | Admit: 2020-08-10 | Discharge: 2020-08-10 | Disposition: A | Discharge status: Home / Self Care | Payer: BLUE CROSS/BLUE SHIELD | Source: Ambulatory Visit | Attending: Obstetrics and Gynecology | Admitting: Obstetrics and Gynecology

## 2020-08-10 ENCOUNTER — Other Ambulatory Visit: Payer: Self-pay

## 2020-08-10 ENCOUNTER — Other Ambulatory Visit: Payer: Self-pay | Admitting: Obstetrics and Gynecology

## 2020-08-10 DIAGNOSIS — N63 Unspecified lump in unspecified breast: Secondary | ICD-10-CM

## 2020-10-09 ENCOUNTER — Encounter (HOSPITAL_COMMUNITY): Payer: Self-pay | Admitting: *Deleted

## 2020-10-09 ENCOUNTER — Telehealth (HOSPITAL_COMMUNITY): Payer: Self-pay | Admitting: *Deleted

## 2020-10-09 NOTE — Telephone Encounter (Signed)
Preadmission screen  

## 2020-10-10 ENCOUNTER — Encounter (HOSPITAL_COMMUNITY): Payer: Self-pay | Admitting: *Deleted

## 2020-10-10 ENCOUNTER — Telehealth (HOSPITAL_COMMUNITY): Payer: Self-pay | Admitting: *Deleted

## 2020-10-10 NOTE — Telephone Encounter (Signed)
Preadmission screen  

## 2020-10-14 ENCOUNTER — Other Ambulatory Visit (HOSPITAL_COMMUNITY): Payer: BLUE CROSS/BLUE SHIELD

## 2020-10-16 ENCOUNTER — Other Ambulatory Visit: Payer: Self-pay

## 2020-10-16 ENCOUNTER — Inpatient Hospital Stay (HOSPITAL_COMMUNITY): Payer: BLUE CROSS/BLUE SHIELD | Admitting: Anesthesiology

## 2020-10-16 ENCOUNTER — Inpatient Hospital Stay (HOSPITAL_COMMUNITY): Payer: BLUE CROSS/BLUE SHIELD

## 2020-10-16 ENCOUNTER — Inpatient Hospital Stay (HOSPITAL_COMMUNITY)
Admission: AD | Admit: 2020-10-16 | Discharge: 2020-10-17 | DRG: 807 | Disposition: A | Discharge status: Home / Self Care | Payer: BLUE CROSS/BLUE SHIELD | Attending: Obstetrics and Gynecology | Admitting: Obstetrics and Gynecology

## 2020-10-16 ENCOUNTER — Encounter (HOSPITAL_COMMUNITY): Payer: Self-pay | Admitting: Obstetrics and Gynecology

## 2020-10-16 DIAGNOSIS — Z20822 Contact with and (suspected) exposure to covid-19: Secondary | ICD-10-CM | POA: Diagnosis present

## 2020-10-16 DIAGNOSIS — Z3A39 39 weeks gestation of pregnancy: Secondary | ICD-10-CM

## 2020-10-16 DIAGNOSIS — O99824 Streptococcus B carrier state complicating childbirth: Secondary | ICD-10-CM | POA: Diagnosis present

## 2020-10-16 DIAGNOSIS — O26893 Other specified pregnancy related conditions, third trimester: Secondary | ICD-10-CM | POA: Diagnosis present

## 2020-10-16 DIAGNOSIS — Z3490 Encounter for supervision of normal pregnancy, unspecified, unspecified trimester: Secondary | ICD-10-CM

## 2020-10-16 LAB — CBC
HCT: 29.8 % — ABNORMAL LOW (ref 36.0–46.0)
HCT: 38 % (ref 36.0–46.0)
Hemoglobin: 9.1 g/dL — ABNORMAL LOW (ref 12.0–15.0)
Hemoglobin: 12.1 g/dL (ref 12.0–15.0)
MCH: 26.4 pg (ref 26.0–34.0)
MCH: 26.7 pg (ref 26.0–34.0)
MCHC: 30.5 g/dL (ref 30.0–36.0)
MCHC: 31.8 g/dL (ref 30.0–36.0)
MCV: 86.4 fL (ref 80.0–100.0)
MCV: 83.7 fL (ref 80.0–100.0)
Platelets: 139 10*3/uL — ABNORMAL LOW (ref 150–400)
Platelets: 178 10*3/uL (ref 150–400)
RBC: 3.45 MIL/uL — ABNORMAL LOW (ref 3.87–5.11)
RBC: 4.54 MIL/uL (ref 3.87–5.11)
RDW: 14.6 % (ref 11.5–15.5)
RDW: 14.7 % (ref 11.5–15.5)
WBC: 7.8 10*3/uL (ref 4.0–10.5)
WBC: 10.7 10*3/uL — ABNORMAL HIGH (ref 4.0–10.5)
nRBC: 0 % (ref 0.0–0.2)

## 2020-10-16 LAB — RPR: RPR Ser Ql: NONREACTIVE

## 2020-10-16 LAB — RESPIRATORY PANEL BY RT PCR (FLU A&B, COVID)
Influenza A by PCR: NEGATIVE
Influenza B by PCR: NEGATIVE
SARS Coronavirus 2 by RT PCR: NEGATIVE

## 2020-10-16 LAB — TYPE AND SCREEN
ABO/RH(D): A POS
Antibody Screen: NEGATIVE

## 2020-10-16 MED ORDER — OXYTOCIN-SODIUM CHLORIDE 30-0.9 UT/500ML-% IV SOLN
1.0000 m[IU]/min | INTRAVENOUS | Status: DC
  Administered 2020-10-16: 2 m[IU]/min via INTRAVENOUS
  Filled 2020-10-16: qty 500

## 2020-10-16 MED ORDER — DIBUCAINE (PERIANAL) 1 % EX OINT: 1.0000 "application " | TOPICAL_OINTMENT | CUTANEOUS | Status: DC | PRN

## 2020-10-16 MED ORDER — FLEET ENEMA 7-19 GM/118ML RE ENEM: 1.0000 | ENEMA | Freq: Every day | RECTAL | Status: DC | PRN

## 2020-10-16 MED ORDER — ACETAMINOPHEN 325 MG PO TABS: 650.0000 mg | ORAL_TABLET | ORAL | Status: DC | PRN

## 2020-10-16 MED ORDER — SODIUM CHLORIDE (PF) 0.9 % IJ SOLN
INTRAMUSCULAR | Status: DC | PRN
Start: 2020-10-16 — End: 2020-10-16
  Administered 2020-10-16: 12 mL/h via EPIDURAL

## 2020-10-16 MED ORDER — SOD CITRATE-CITRIC ACID 500-334 MG/5ML PO SOLN: 30.0000 mL | ORAL | Status: DC | PRN

## 2020-10-16 MED ORDER — OXYTOCIN BOLUS FROM INFUSION
333.0000 mL | Freq: Once | INTRAVENOUS | Status: AC
  Administered 2020-10-16: 333 mL via INTRAVENOUS

## 2020-10-16 MED ORDER — WITCH HAZEL-GLYCERIN EX PADS: 1.0000 "application " | MEDICATED_PAD | CUTANEOUS | Status: DC | PRN

## 2020-10-16 MED ORDER — TERBUTALINE SULFATE 1 MG/ML IJ SOLN: 0.2500 mg | Freq: Once | INTRAMUSCULAR | Status: DC | PRN

## 2020-10-16 MED ORDER — LACTATED RINGERS IV SOLN
500.0000 mL | Freq: Once | INTRAVENOUS | Status: AC
  Administered 2020-10-16: 500 mL via INTRAVENOUS

## 2020-10-16 MED ORDER — PHENYLEPHRINE 40 MCG/ML (10ML) SYRINGE FOR IV PUSH (FOR BLOOD PRESSURE SUPPORT)
80.0000 ug | PREFILLED_SYRINGE | INTRAVENOUS | Status: DC | PRN
  Filled 2020-10-16: qty 10

## 2020-10-16 MED ORDER — TETANUS-DIPHTH-ACELL PERTUSSIS 5-2.5-18.5 LF-MCG/0.5 IM SUSY: 0.5000 mL | PREFILLED_SYRINGE | Freq: Once | INTRAMUSCULAR | Status: DC

## 2020-10-16 MED ORDER — ONDANSETRON HCL 4 MG PO TABS: 4.0000 mg | ORAL_TABLET | ORAL | Status: DC | PRN

## 2020-10-16 MED ORDER — EPHEDRINE 5 MG/ML INJ: 10.0000 mg | INTRAVENOUS | Status: DC | PRN

## 2020-10-16 MED ORDER — BISACODYL 10 MG RE SUPP: 10.0000 mg | Freq: Every day | RECTAL | Status: DC | PRN

## 2020-10-16 MED ORDER — LACTATED RINGERS IV SOLN: 500.0000 mL | INTRAVENOUS | Status: DC | PRN

## 2020-10-16 MED ORDER — SENNOSIDES-DOCUSATE SODIUM 8.6-50 MG PO TABS
2.0000 | ORAL_TABLET | ORAL | Status: DC
  Administered 2020-10-17: 2 via ORAL
  Filled 2020-10-16: qty 2

## 2020-10-16 MED ORDER — DIPHENHYDRAMINE HCL 50 MG/ML IJ SOLN: 12.5000 mg | INTRAMUSCULAR | Status: DC | PRN

## 2020-10-16 MED ORDER — ONDANSETRON HCL 4 MG/2ML IJ SOLN: 4.0000 mg | INTRAMUSCULAR | Status: DC | PRN

## 2020-10-16 MED ORDER — BENZOCAINE-MENTHOL 20-0.5 % EX AERO: 1.0000 "application " | INHALATION_SPRAY | CUTANEOUS | Status: DC | PRN

## 2020-10-16 MED ORDER — SODIUM CHLORIDE 0.9 % IV SOLN
5.0000 10*6.[IU] | Freq: Once | INTRAVENOUS | Status: AC
  Administered 2020-10-16: 5 10*6.[IU] via INTRAVENOUS
  Filled 2020-10-16: qty 5

## 2020-10-16 MED ORDER — MEDROXYPROGESTERONE ACETATE 150 MG/ML IM SUSP: 150.0000 mg | INTRAMUSCULAR | Status: DC | PRN

## 2020-10-16 MED ORDER — SIMETHICONE 80 MG PO CHEW: 80.0000 mg | CHEWABLE_TABLET | ORAL | Status: DC | PRN

## 2020-10-16 MED ORDER — ONDANSETRON HCL 4 MG/2ML IJ SOLN: 4.0000 mg | Freq: Four times a day (QID) | INTRAMUSCULAR | Status: DC | PRN

## 2020-10-16 MED ORDER — MISOPROSTOL 25 MCG QUARTER TABLET
25.0000 ug | ORAL_TABLET | ORAL | Status: DC | PRN
  Administered 2020-10-16: 25 ug via VAGINAL
  Filled 2020-10-16: qty 1

## 2020-10-16 MED ORDER — MEASLES, MUMPS & RUBELLA VAC IJ SOLR: 0.5000 mL | Freq: Once | INTRAMUSCULAR | Status: DC

## 2020-10-16 MED ORDER — COCONUT OIL OIL: 1.0000 "application " | TOPICAL_OIL | Status: DC | PRN

## 2020-10-16 MED ORDER — OXYTOCIN-SODIUM CHLORIDE 30-0.9 UT/500ML-% IV SOLN: 2.5000 [IU]/h | INTRAVENOUS | Status: DC

## 2020-10-16 MED ORDER — LIDOCAINE HCL (PF) 1 % IJ SOLN
INTRAMUSCULAR | Status: DC | PRN
  Administered 2020-10-16: 6 mL via EPIDURAL

## 2020-10-16 MED ORDER — OXYCODONE-ACETAMINOPHEN 5-325 MG PO TABS: 1.0000 | ORAL_TABLET | ORAL | Status: DC | PRN

## 2020-10-16 MED ORDER — ZOLPIDEM TARTRATE 5 MG PO TABS: 5.0000 mg | ORAL_TABLET | Freq: Every evening | ORAL | Status: DC | PRN

## 2020-10-16 MED ORDER — IBUPROFEN 600 MG PO TABS
600.0000 mg | ORAL_TABLET | Freq: Four times a day (QID) | ORAL | Status: DC
  Administered 2020-10-16 – 2020-10-17 (×5): 600 mg via ORAL
  Filled 2020-10-16 (×5): qty 1

## 2020-10-16 MED ORDER — FENTANYL-BUPIVACAINE-NACL 0.5-0.125-0.9 MG/250ML-% EP SOLN
12.0000 mL/h | EPIDURAL | Status: DC | PRN
  Filled 2020-10-16: qty 250

## 2020-10-16 MED ORDER — DIPHENHYDRAMINE HCL 25 MG PO CAPS: 25.0000 mg | ORAL_CAPSULE | Freq: Four times a day (QID) | ORAL | Status: DC | PRN

## 2020-10-16 MED ORDER — PENICILLIN G POT IN DEXTROSE 60000 UNIT/ML IV SOLN
3.0000 10*6.[IU] | INTRAVENOUS | Status: DC
  Administered 2020-10-16 (×3): 3 10*6.[IU] via INTRAVENOUS
  Filled 2020-10-16 (×3): qty 50

## 2020-10-16 MED ORDER — LACTATED RINGERS IV SOLN: INTRAVENOUS | Status: DC

## 2020-10-16 MED ORDER — PRENATAL MULTIVITAMIN CH
1.0000 | ORAL_TABLET | Freq: Every day | ORAL | Status: DC
  Administered 2020-10-17: 1 via ORAL
  Filled 2020-10-16: qty 1

## 2020-10-16 MED ORDER — PHENYLEPHRINE 40 MCG/ML (10ML) SYRINGE FOR IV PUSH (FOR BLOOD PRESSURE SUPPORT): 80.0000 ug | PREFILLED_SYRINGE | INTRAVENOUS | Status: DC | PRN

## 2020-10-16 MED ORDER — LIDOCAINE HCL (PF) 1 % IJ SOLN: 30.0000 mL | INTRAMUSCULAR | Status: DC | PRN

## 2020-10-16 MED ORDER — OXYCODONE-ACETAMINOPHEN 5-325 MG PO TABS: 2.0000 | ORAL_TABLET | ORAL | Status: DC | PRN

## 2020-10-16 NOTE — Anesthesia Preprocedure Evaluation (Signed)
Anesthesia Evaluation  Patient identified by MRN, date of birth, ID band Patient awake    Reviewed: Patient's Chart, lab work & pertinent test results  Airway Mallampati: II  TM Distance: >3 FB Neck ROM: Full    Dental  (+) Teeth Intact   Pulmonary neg pulmonary ROS,    Pulmonary exam normal        Cardiovascular negative cardio ROS   Rhythm:Regular Rate:Normal     Neuro/Psych negative neurological ROS  negative psych ROS   GI/Hepatic negative GI ROS, Neg liver ROS,   Endo/Other  negative endocrine ROS  Renal/GU negative Renal ROS  negative genitourinary   Musculoskeletal negative musculoskeletal ROS (+)   Abdominal (+)  Abdomen: soft. Bowel sounds: normal.  Peds  Hematology negative hematology ROS (+)   Anesthesia Other Findings   Reproductive/Obstetrics (+) Pregnancy                             Anesthesia Physical Anesthesia Plan  ASA: II  Anesthesia Plan: Epidural   Post-op Pain Management:    Induction:   PONV Risk Score and Plan: 2  Airway Management Planned: Natural Airway  Additional Equipment: None  Intra-op Plan:   Post-operative Plan:   Informed Consent: I have reviewed the patients History and Physical, chart, labs and discussed the procedure including the risks, benefits and alternatives for the proposed anesthesia with the patient or authorized representative who has indicated his/her understanding and acceptance.     Dental advisory given  Plan Discussed with:   Anesthesia Plan Comments: (Lab Results      Component                Value               Date                      WBC                      10.7 (H)            10/16/2020                HGB                      12.1                10/16/2020                HCT                      38.0                10/16/2020                MCV                      83.7                10/16/2020                 PLT                      178                 10/16/2020          )  Anesthesia Quick Evaluation  

## 2020-10-16 NOTE — Anesthesia Procedure Notes (Signed)
Epidural Patient location during procedure: OB Start time: 10/16/2020 10:00 AM End time: 10/16/2020 10:05 AM  Staffing Anesthesiologist: Atilano Median, DO Performed: anesthesiologist   Preanesthetic Checklist Completed: patient identified, IV checked, site marked, risks and benefits discussed, surgical consent, monitors and equipment checked, pre-op evaluation and timeout performed  Epidural Patient position: sitting Prep: ChloraPrep Patient monitoring: heart rate, continuous pulse ox and blood pressure Approach: midline Location: L3-L4 Injection technique: LOR saline  Needle:  Needle type: Tuohy  Needle gauge: 17 G Needle length: 9 cm Needle insertion depth: 6.5 cm Catheter type: closed end flexible Catheter size: 20 Guage Catheter at skin depth: 11 cm Test dose: negative and 1.5% lidocaine  Assessment Events: blood not aspirated, injection not painful, no injection resistance and no paresthesia  Additional Notes  Patient identified. Risks/Benefits/Options discussed with patient including but not limited to bleeding, infection, nerve damage, paralysis, failed block, incomplete pain control, headache, blood pressure changes, nausea, vomiting, reactions to medications, itching and postpartum back pain. Confirmed with bedside nurse the patient's most recent platelet count. Confirmed with patient that they are not currently taking any anticoagulation, have any bleeding history or any family history of bleeding disorders. Patient expressed understanding and wished to proceed. All questions were answered. Sterile technique was used throughout the entire procedure. Please see nursing notes for vital signs. Test dose was given through epidural catheter and negative prior to continuing to dose epidural or start infusion. Warning signs of high block given to the patient including shortness of breath, tingling/numbness in hands, complete motor block, or any concerning symptoms with  instructions to call for help. Patient was given instructions on fall risk and not to get out of bed. All questions and concerns addressed with instructions to call with any issues or inadequate analgesia.    Reason for block:procedure for pain

## 2020-10-16 NOTE — H&P (Signed)
Sheryl Green is a 34 y.o.G 2 P 1001 at 39 w 4 days presents for IOL Status post 1 cytotec now on pitocin OB History    Gravida  2   Para  1   Term  1   Preterm      AB      Living  1     SAB      TAB      Ectopic      Multiple  0   Live Births  1          Past Medical History:  Diagnosis Date  . Medical history non-contributory    Past Surgical History:  Procedure Laterality Date  . NO PAST SURGERIES    . WISDOM TOOTH EXTRACTION     Family History: family history includes Breast cancer in her maternal grandmother and paternal aunt; Colon cancer in her maternal grandmother; Hypertension in her father. Social History:  reports that she has never smoked. She has never used smokeless tobacco. She reports that she does not drink alcohol and does not use drugs.     Maternal Diabetes: No Genetic Screening: Normal Maternal Ultrasounds/Referrals: Normal Fetal Ultrasounds or other Referrals:  None Maternal Substance Abuse:  No Significant Maternal Medications:  None Significant Maternal Lab Results:  None Other Comments:  None  Review of Systems  All other systems reviewed and are negative.  Maternal Medical History:  Fetal activity: Perceived fetal activity is normal.      Dilation: 3.5 Effacement (%): Thick Station: -2 Exam by:: Dr. Vincente Poli Blood pressure 108/75, pulse 86, temperature 98 F (36.7 C), temperature source Oral, resp. rate 18, height 5\' 6"  (1.676 m), weight 81.5 kg, last menstrual period 10/20/2019, unknown if currently breastfeeding. Maternal Exam:  Uterine Assessment: Contraction strength is moderate.  Contraction frequency is regular.   Abdomen: Fetal presentation: vertex     Fetal Exam Fetal State Assessment: Category I - tracings are normal.     Physical Exam Vitals and nursing note reviewed. Exam conducted with a chaperone present.  Constitutional:      Appearance: Normal appearance.  HENT:     Head: Normocephalic.      Right Ear: Tympanic membrane normal.  Eyes:     Pupils: Pupils are equal, round, and reactive to light.  Cardiovascular:     Rate and Rhythm: Normal rate and regular rhythm.  Neurological:     Mental Status: She is alert.     Prenatal labs: ABO, Rh: --/--/A POS (11/08 11-04-2004) Antibody: NEG (11/08 0027) Rubella: Immune (04/13 0000) RPR: Nonreactive (04/13 0000)  HBsAg: Negative (04/13 0000)  HIV: Non-reactive (04/13 0000)  GBS: Positive/-- (04/13 0000)   Assessment/Plan: IUP at 39 w 4 days IOL AROM after epidural Continue pitocin  03-18-1983 10/16/2020, 7:45 AM

## 2020-10-16 NOTE — Progress Notes (Signed)
Patient is comfortable with Epidural  FHR Category 1 Cervix is 80% 6 cm -2  SROM clear fluid  Continue to follow labor curve

## 2020-10-16 NOTE — Lactation Note (Addendum)
This note was copied from a baby's chart. Lactation Consultation Note  Patient Name: Sheryl Green ASTMH'D Date: 10/16/2020 Reason for consult: Initial assessment;Term P2, 4 hour term female infant. Per mom, she has Spectra 2 DEBP at home. Infant had one void diaper since birth. Per mom, infant is latching well, BF for 30 minutes prior to [facility] entering the room. Infant was asleep latched on mom's breast , mom broke latch and her nipple was rounded and completely everted outward, mom has inverted nipples both breast. Infant appeared content and dad swaddled infant and placed infant in basinet. Per mom, her 27 year old daughter did not latch well, she pumped and supplemented with formula, she BF for 3 months only, developed mastitis and had oversupply. Mom will do breast stimulation prior to latching infant at breast due to having inverted nipples. LC reviewed hand expression and mom easily expressed 4 mls of colostrum in a bullet, she will offer to infant after latching infant at breast , at next feeding. Mom knows to BF infant according to hunger cues, 8 to 12+ times within 24 hours, STS. Mom knows to call RN or LC if she needs assistance with latching infant at the breast. LC suggested mom attend [facility] BF support group after discharge from hospital ( free).  Mom made aware of O/P services, breastfeeding support groups, community resources, and our phone # for post-discharge questions.   Maternal Data Formula Feeding for Exclusion: No Does the patient have breastfeeding experience prior to this delivery?: Yes  Feeding Feeding Type: Breast Fed  LATCH Score             Interventions Interventions: Breast feeding basics reviewed;Breast compression;Skin to skin;Hand express  Lactation Tools Discussed/Used WIC Program: No   Consult Status Consult Status: Follow-up Date: 10/17/20 Follow-up type: In-patient    Danelle Earthly 10/16/2020, 9:28 PM

## 2020-10-17 ENCOUNTER — Ambulatory Visit: Payer: Self-pay

## 2020-10-17 LAB — CBC
HCT: 32.5 % — ABNORMAL LOW (ref 36.0–46.0)
Hemoglobin: 10.3 g/dL — ABNORMAL LOW (ref 12.0–15.0)
MCH: 26.7 pg (ref 26.0–34.0)
MCHC: 31.7 g/dL (ref 30.0–36.0)
MCV: 84.2 fL (ref 80.0–100.0)
Platelets: 151 10*3/uL (ref 150–400)
RBC: 3.86 MIL/uL — ABNORMAL LOW (ref 3.87–5.11)
RDW: 14.9 % (ref 11.5–15.5)
WBC: 10.2 10*3/uL (ref 4.0–10.5)
nRBC: 0 % (ref 0.0–0.2)

## 2020-10-17 LAB — RUBELLA SCREEN: Rubella: <0.9 index — ABNORMAL LOW (ref >0.99)

## 2020-10-17 NOTE — Progress Notes (Signed)
Postpartum Progress Note  Post Partum Day 1 s/p spontaneous vaginal delivery.  Patient reports well-controlled pain, ambulating without difficulty, voiding spontaneously, tolerating PO.  Vaginal bleeding is appropriate.   Objective: Blood pressure 105/66, pulse 68, temperature 97.8 F (36.6 C), temperature source Axillary, resp. rate 18, height 5\' 6"  (1.676 m), weight 81.5 kg, last menstrual period 10/20/2019, SpO2 100 %, unknown if currently breastfeeding.  Physical Exam:  General: alert, no distress Lochia: appropriate Uterine Fundus: firm DVT Evaluation: No evidence of DVT seen on physical exam.  Recent Labs    10/16/20 0740 10/17/20 0535  HGB 12.1 10.3*  HCT 38.0 32.5*    Assessment/Plan: . Postpartum Day 1, s/p vaginal delivery. . Continue routine postpartum care . Lactation following . Circ when able . Anticipate discharge home tomorrow   LOS: 1 day   13/09/21 10/17/2020, 7:28 AM

## 2020-10-17 NOTE — Lactation Note (Signed)
This note was copied from a baby's chart. Lactation Consultation Note  Patient Name: Sheryl Green [id number] Date: 10/17/2020   Baby Sheryl Ashmore now 14 hours old.  Parents report that he is having trouble latching. Parents report RN got him latched on her left breast and he fed pretty well but then got fussy and would not latch on to the right.  LC discussed prepumping to try and elongate/evert nipples with mom.  Parents in agreement.  Used manual pump to prepump nipple on right breast.  Nipple everts well.  Laid mom back some and assisted in latching him to right breast.  Infant latched well.  He fed sleepily with some rhythmic sucking and intermittent swallows.  Showed dad how to compress and massage breast to help stimulate him to stay awake and get more milk. He is also post circ. Parents report that with their last baby the second night cluster feeding was hard. Discussed ways to help the second night go a little smoother.  Parents would like to supplement with donor milk is needed.  Let them know that was fine but her milk would be better if she had it and we could get it.  Discussed hand expressing and pumping with manual pump. May add DEBP if supplementing with donor.   Discussed that hopefully she will not have the oversupply she had with her last baby.  That  baby can do better job regulating what he needs than the pump. Left mom and baby breastfeeding.    Maternal Data    Feeding Feeding Type: Breast Fed  LATCH Score Latch: Repeated attempts needed to sustain latch, nipple held in mouth throughout feeding, stimulation needed to elicit sucking reflex.  Audible Swallowing: A few with stimulation  Type of Nipple: Flat  Comfort (Breast/Nipple): Soft / non-tender  Hold (Positioning): Assistance needed to correctly position infant at breast and maintain latch.  LATCH Score: 6  Interventions    Lactation Tools Discussed/Used     Consult Status      Kennetta Pavlovic Michaelle Copas 10/17/2020, 5:02 PM

## 2020-10-17 NOTE — Anesthesia Postprocedure Evaluation (Signed)
Anesthesia Post Note  Patient: Sheryl Green  Procedure(s) Performed: AN AD HOC LABOR EPIDURAL     Patient location during evaluation: Mother Baby Anesthesia Type: Epidural Level of consciousness: awake and alert Pain management: pain level controlled Vital Signs Assessment: post-procedure vital signs reviewed and stable Respiratory status: spontaneous breathing, nonlabored ventilation and respiratory function stable Cardiovascular status: stable Postop Assessment: no headache, no backache, epidural receding, patient able to bend at knees, no apparent nausea or vomiting, able to ambulate and adequate PO intake Anesthetic complications: no   No complications documented.  Last Vitals:  Vitals:   10/17/20 0037 10/17/20 0520  BP: 98/65 105/66  Pulse: 68 68  Resp: 16 18  Temp: 36.7 C 36.6 C  SpO2: 99% 100%    Last Pain:  Vitals:   10/17/20 0520  TempSrc: Axillary  PainSc:    Pain Goal: Patients Stated Pain Goal: 6 (10/17/20 0037)                 Blythe Stanford

## 2020-10-17 NOTE — Discharge Summary (Signed)
Obstetric Discharge Summary  Sheryl Green is a 34 y.o. female that presented on 10/16/2020 for induction of labor at [redacted]w[redacted]d.  Her labor course was uncomplicated and she delivered a viable female infant on 10/16/2020.  Her postpartum course was uncomplicated and on PPD#1, she reported well controlled pain, spontaneous voiding, ambulating without difficulty, and tolerating PO.  She was stable for discharge home on 10/17/2020 with plans for in-office follow up.  Hemoglobin  Date Value Ref Range Status  10/17/2020 10.3 (L) 12.0 - 15.0 g/dL Final   HCT  Date Value Ref Range Status  10/17/2020 32.5 (L) 36 - 46 % Final    Physical Exam:  General: alert and no distress Lochia: appropriate Uterine Fundus: firm DVT Evaluation: No evidence of DVT seen on physical exam.  Discharge Diagnoses: Term Pregnancy-delivered  Discharge Information: Date: 10/17/2020 Activity: pelvic rest Diet: routine Medications: None Condition: stable Instructions: refer to practice specific booklet Discharge to: home   Newborn Data: Live born female  Birth Weight: 9 lb 4 oz (4196 g) APGAR: 8, 9  Newborn Delivery   Birth date/time: 10/16/2020 16:34:00 Delivery type: Vaginal, Spontaneous      Home with mother.  Lyn Henri 10/17/2020, 5:40 PM

## 2020-10-17 NOTE — Lactation Note (Signed)
This note was copied from a baby's chart. Lactation Consultation Note  Patient Name: Boy Jancie Kercher Today's Date: 10/17/2020   Parents trying to latch infant on arrival.  Noted small blister to moms right nipple.  Mom reported they thought at first it was milk.   Minimal assist with latching infant to right breast.  Mom reports that it is starting to get a little sore.  Discussed with dad just doing massage down towards nipple instead of compression.  Maybe moving breast around too much.Urged hand expression and rubbing expressed mothers milk on nipples and air dry. Gave mom comfort gels as well and put them on her.  Discussed pumpinng and/or donor milk.  Parents being d/c tonight. Have a ride home to [place].  Concerned about infant being fussy.  Discussed he has been through a lot today with being born, circumcision, blood work.  That he may be fussy.  Just to make sure he is feeding well and having adequate output.  Urged mom to follow up with lactation as needed.    Zaylon Bossier Michaelle Copas 10/17/2020, 8:44 PM

## 2020-10-17 NOTE — Social Work (Signed)
MOB was referred for history of anxiety.   * Referral screened out by Clinical Social Worker because none of the following criteria appear to apply:  ~ History of anxiety/depression during this pregnancy, or of post-partum depression following prior delivery. ~ Diagnosis of anxiety and/or depression within last 3 years. CSW reviewed chart and noted the only mention of anxiety in 2018, in reference to a pelvic exam.  OR * MOB's symptoms currently being treated with medication and/or therapy.  Please contact the Clinical Social Worker if needs arise, by MOB request, or if MOB scores greater than 9/yes to question 10 on Edinburgh Postpartum Depression Screen.  Lummie Montijo, LCSWA Clinical Social Work [facility]  [phone] 

## 2020-10-18 ENCOUNTER — Inpatient Hospital Stay (HOSPITAL_COMMUNITY): Payer: BLUE CROSS/BLUE SHIELD

## 2020-11-15 ENCOUNTER — Other Ambulatory Visit: Payer: Self-pay

## 2020-11-15 ENCOUNTER — Ambulatory Visit
Admission: RE | Admit: 2020-11-15 | Discharge: 2020-11-15 | Disposition: A | Discharge status: Home / Self Care | Payer: BLUE CROSS/BLUE SHIELD | Source: Ambulatory Visit | Attending: Radiology | Admitting: Radiology

## 2020-11-15 DIAGNOSIS — N6320 Unspecified lump in the left breast, unspecified quadrant: Secondary | ICD-10-CM

## 2021-10-26 ENCOUNTER — Other Ambulatory Visit: Payer: Self-pay | Admitting: Radiology

## 2021-10-26 DIAGNOSIS — Z09 Encounter for follow-up examination after completed treatment for conditions other than malignant neoplasm: Secondary | ICD-10-CM

## 2021-11-15 ENCOUNTER — Other Ambulatory Visit: Payer: Self-pay | Admitting: Radiology

## 2021-11-15 DIAGNOSIS — N6320 Unspecified lump in the left breast, unspecified quadrant: Secondary | ICD-10-CM

## 2021-11-20 ENCOUNTER — Ambulatory Visit
Admission: RE | Admit: 2021-11-20 | Discharge: 2021-11-20 | Disposition: A | Discharge status: Home / Self Care | Payer: No Typology Code available for payment source | Source: Ambulatory Visit | Attending: Radiology | Admitting: Radiology

## 2021-11-20 ENCOUNTER — Other Ambulatory Visit: Payer: Self-pay | Admitting: Radiology

## 2021-11-20 ENCOUNTER — Ambulatory Visit
Admission: RE | Admit: 2021-11-20 | Discharge: 2021-11-20 | Disposition: A | Discharge status: Home / Self Care | Payer: BLUE CROSS/BLUE SHIELD | Source: Ambulatory Visit | Attending: Radiology | Admitting: Radiology

## 2021-11-20 DIAGNOSIS — N6320 Unspecified lump in the left breast, unspecified quadrant: Secondary | ICD-10-CM

## 2022-06-06 IMAGING — MG DIGITAL DIAGNOSTIC BILAT W/ TOMO W/ CAD
6 of 10 series · 6 of 30 positions shown · non-contrast
Comparison: 11/15/2020 and earlier

CLINICAL DATA: Two year follow-up for probably benign mass in the
LEFT breast. Patient has noted gradual inversion of the RIGHT nipple
since breastfeeding her first child, worse over the last 6 months.



[L CC synth-2D]
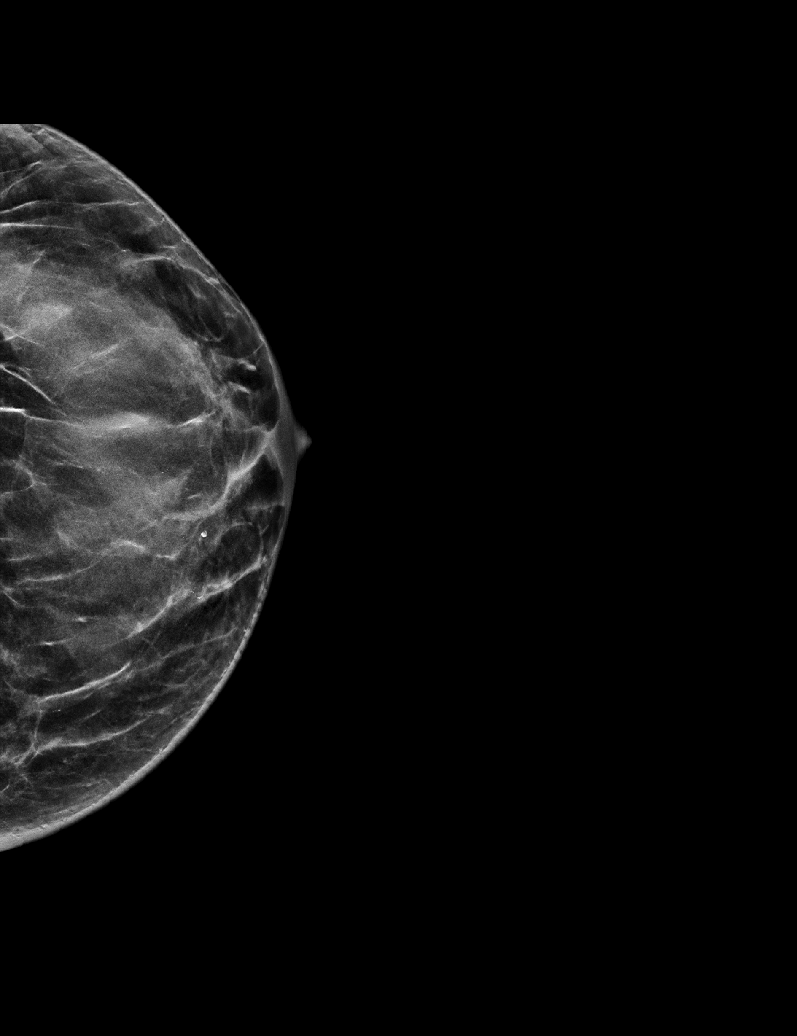

[R CC synth-2D (1 of 2)]
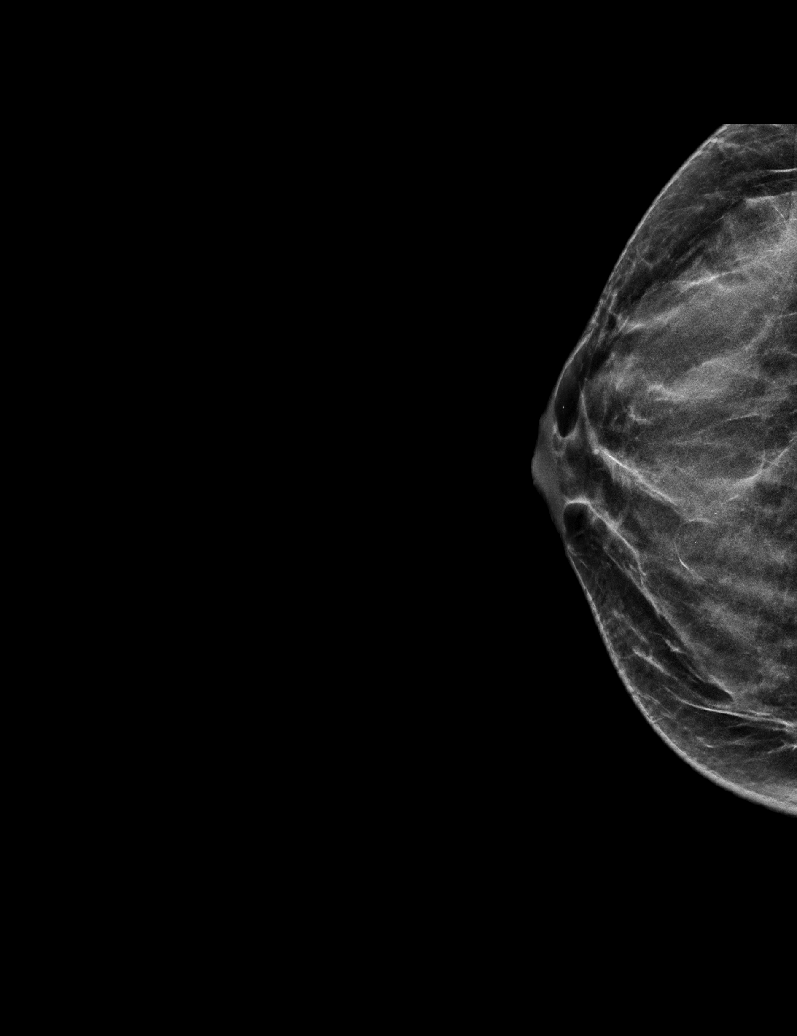

[L MLO synth-2D]
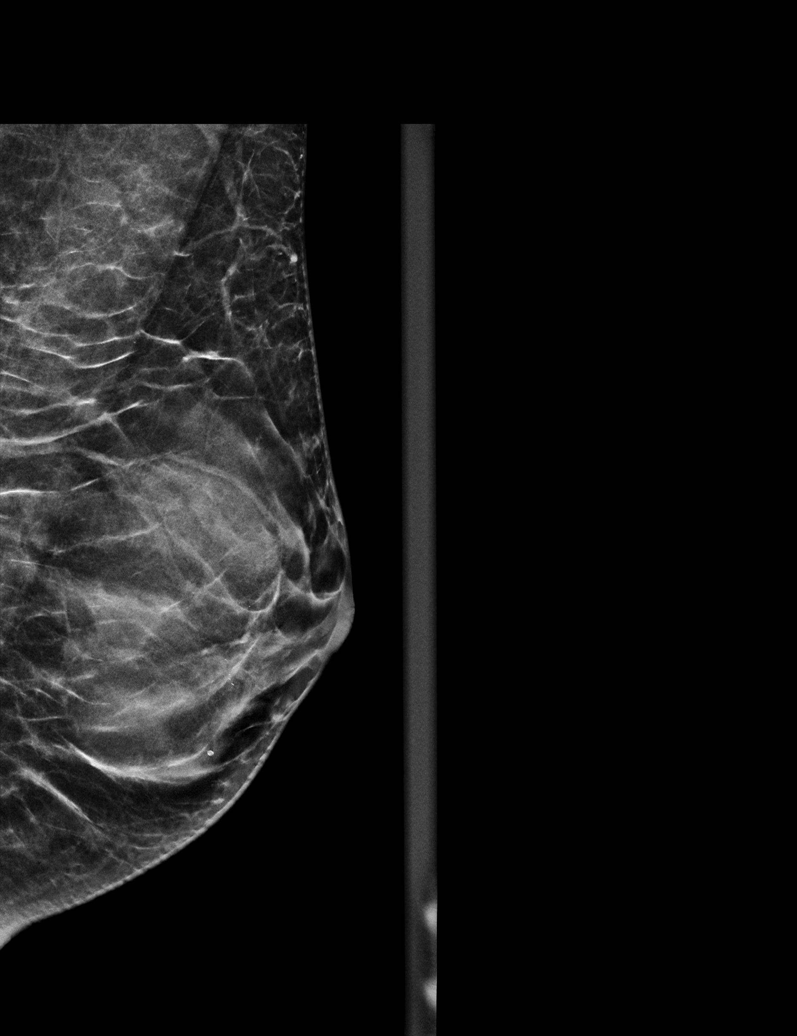

[R CC synth-2D (2 of 2)]
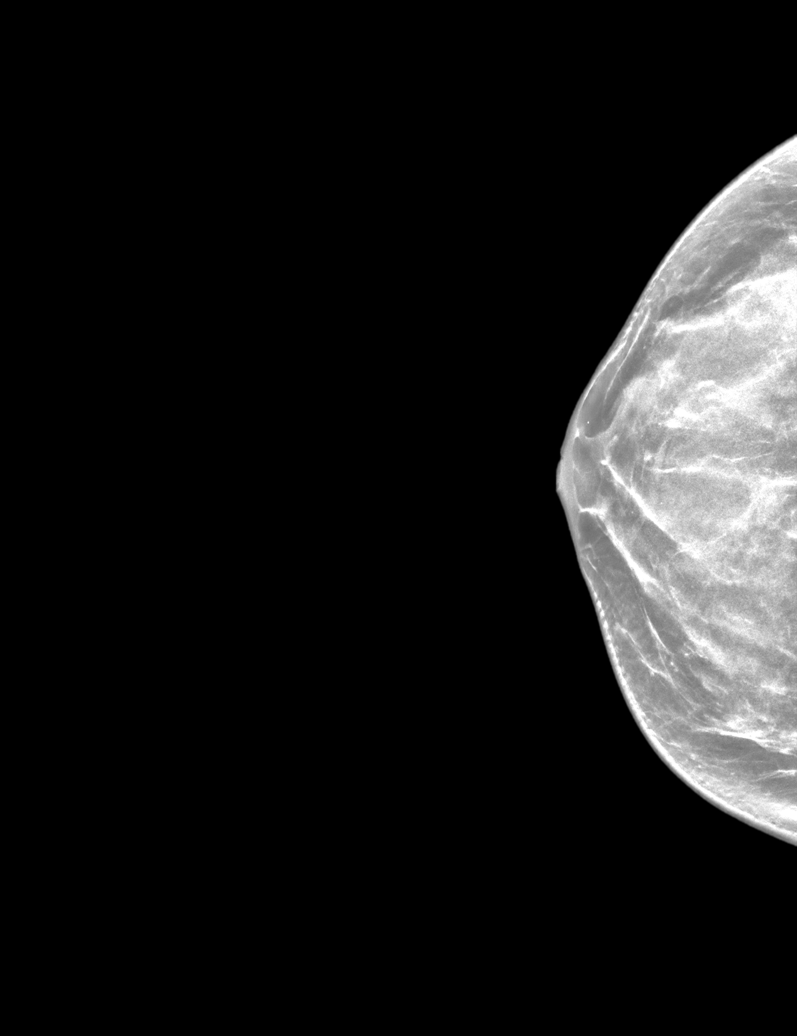

[R MLO synth-2D]
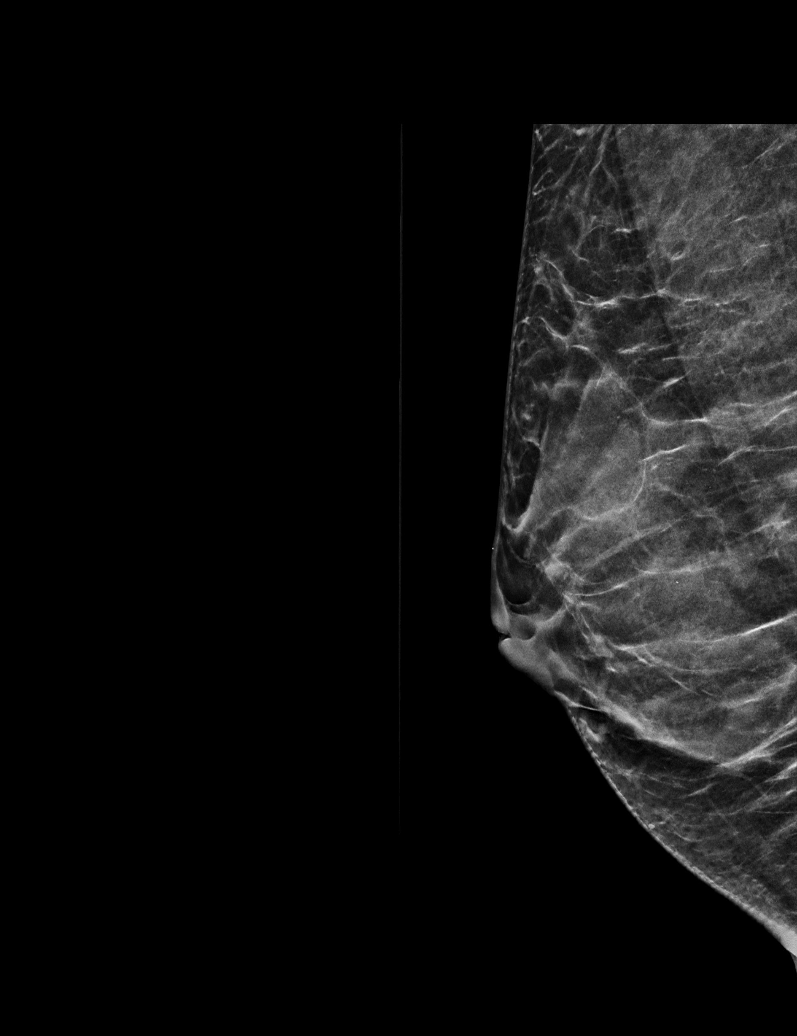

[R CC tomo · tomo slice 23/45.0]
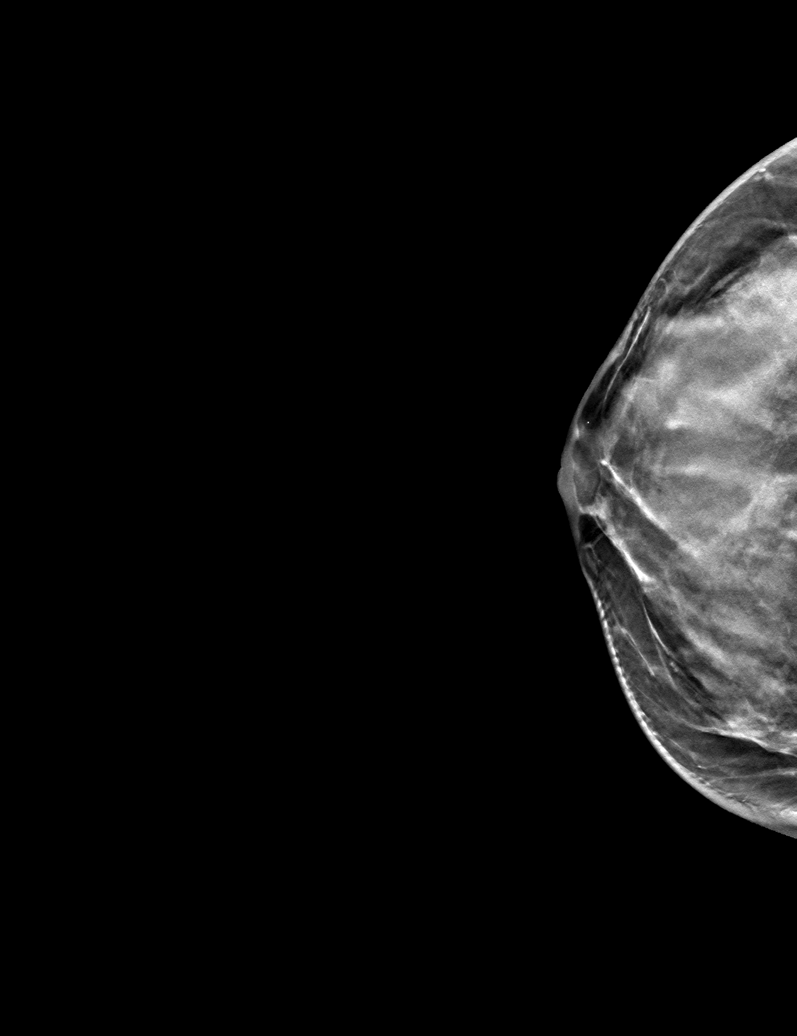

[6 of 30 positions shown; findings below may reference images not displayed]

ACR Breast Density Category d: The breast tissue is extremely dense,
which lowers the sensitivity of mammography.
FINDINGS: RIGHT BREAST:

Mammogram: There is new RIGHT nipple inversion compared to prior
studies. Otherwise the RIGHT breast negative. Mammographic images
were processed with CAD.

Physical Exam: RIGHT nipple is inverted. I am able to Sejourne the
nipple on exam. RIGHT nipple is otherwise normal in appearance.

Ultrasound: Targeted ultrasound is performed, showing normal
appearing fibroglandular tissue in retroareolar region of the RIGHT
breast. No suspicious mass, distortion, or acoustic shadowing is
demonstrated with ultrasound.

LEFT BREAST:

Mammogram: No suspicious mass, distortion, or microcalcifications
are identified to suggest presence of malignancy. Mammographic
images were processed with CAD.

Ultrasound: Targeted ultrasound is performed, showing circumscribed
oval hypoechoic and anechoic mass in the 12 o'clock location of the
LEFT breast 1 centimeter from the nipple, now measuring 0.6 x 0.5 x
0.5 centimeters. Initially mass measured 1.7 x 1.2 x
centimeters.

The knee
IMPRESSION: 1. Benign, physiologic RIGHT nipple inversion.
2. Significant decrease in size of LEFT breast mass, favoring
resolving lactating adenoma. No specific follow-up is needed for
this lesion.

RECOMMENDATION:
Recommend screening mammogram at age 40 unless there are persistent
or intervening clinical concerns. (Code:[id number])

I have discussed the findings and recommendations with the patient.
If applicable, a reminder letter will be sent to the patient
regarding the next appointment.

BI-RADS CATEGORY  2: Benign.
